# Patient Record
Sex: Female | Born: 1995 | Race: White | Hispanic: No | Marital: Single | Smoking: Never smoker
Health system: Southern US, Community
[De-identification: ages and names within clinical notes are randomized; demographics above are authoritative.]

## PROBLEM LIST (undated history)

## (undated) DIAGNOSIS — B9681 Helicobacter pylori [H. pylori] as the cause of diseases classified elsewhere: Secondary | ICD-10-CM

## (undated) DIAGNOSIS — F32A Depression, unspecified: Secondary | ICD-10-CM

## (undated) DIAGNOSIS — F419 Anxiety disorder, unspecified: Secondary | ICD-10-CM

## (undated) DIAGNOSIS — F329 Major depressive disorder, single episode, unspecified: Secondary | ICD-10-CM

## (undated) HISTORY — DX: Depression, unspecified: F32.A

## (undated) HISTORY — DX: Anxiety disorder, unspecified: F41.9

---
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
## 2019-01-05 LAB — HEPATIC FUNCTION PANEL
ALT: 12 (ref 7–35)
AST: 13 (ref 13–35)
Alkaline Phosphatase: 40 (ref 25–125)
Bilirubin, Total: 0.5

## 2019-01-05 LAB — BASIC METABOLIC PANEL
BUN: 16 (ref 4–21)
Creatinine: 0.9 (ref 0.5–1.1)
Glucose: 92
Potassium: 3.8 (ref 3.4–5.3)
Sodium: 145 (ref 137–147)

## 2019-01-05 LAB — CBC AND DIFFERENTIAL
HCT: 40 (ref 36–46)
Hemoglobin: 13.6 (ref 12.0–16.0)
Platelets: 224 (ref 150–399)
WBC: 4.5

## 2019-01-05 LAB — TSH: TSH: 1.04 (ref 0.41–5.90)

## 2019-01-06 ENCOUNTER — Other Ambulatory Visit: Payer: Self-pay

## 2019-01-06 ENCOUNTER — Emergency Department (HOSPITAL_COMMUNITY): Payer: BC Managed Care – PPO

## 2019-01-06 ENCOUNTER — Encounter (HOSPITAL_COMMUNITY): Payer: Self-pay | Admitting: Emergency Medicine

## 2019-01-06 ENCOUNTER — Emergency Department (HOSPITAL_COMMUNITY)
Admission: EM | Admit: 2019-01-06 | Discharge: 2019-01-07 | Disposition: A | Discharge status: Home / Self Care | Payer: BC Managed Care – PPO | Attending: Emergency Medicine | Admitting: Emergency Medicine

## 2019-01-06 DIAGNOSIS — Z8719 Personal history of other diseases of the digestive system: Secondary | ICD-10-CM | POA: Diagnosis not present

## 2019-01-06 DIAGNOSIS — Z880 Allergy status to penicillin: Secondary | ICD-10-CM | POA: Insufficient documentation

## 2019-01-06 DIAGNOSIS — R030 Elevated blood-pressure reading, without diagnosis of hypertension: Secondary | ICD-10-CM | POA: Diagnosis present

## 2019-01-06 DIAGNOSIS — F419 Anxiety disorder, unspecified: Secondary | ICD-10-CM | POA: Diagnosis not present

## 2019-01-06 HISTORY — DX: Helicobacter pylori (H. pylori) as the cause of diseases classified elsewhere: B96.81

## 2019-01-06 LAB — CBC
HCT: 39.5 % (ref 36.0–46.0)
Hemoglobin: 13.3 g/dL (ref 12.0–15.0)
MCH: 30.8 pg (ref 26.0–34.0)
MCHC: 33.7 g/dL (ref 30.0–36.0)
MCV: 91.4 fL (ref 80.0–100.0)
Platelets: 217 10*3/uL (ref 150–400)
RBC: 4.32 MIL/uL (ref 3.87–5.11)
RDW: 11.9 % (ref 11.5–15.5)
WBC: 5.5 10*3/uL (ref 4.0–10.5)
nRBC: 0 % (ref 0.0–0.2)

## 2019-01-06 LAB — BASIC METABOLIC PANEL
Anion gap: 15 (ref 5–15)
BUN: 12 mg/dL (ref 6–20)
CO2: 20 mmol/L — ABNORMAL LOW (ref 22–32)
Calcium: 9.8 mg/dL (ref 8.9–10.3)
Chloride: 103 mmol/L (ref 98–111)
Creatinine, Ser: 0.89 mg/dL (ref 0.44–1.00)
GFR calc Af Amer: >60 mL/min (ref >60)
GFR calc non Af Amer: >60 mL/min (ref >60)
Glucose, Bld: 88 mg/dL (ref 70–99)
Potassium: 3.4 mmol/L — ABNORMAL LOW (ref 3.5–5.1)
Sodium: 138 mmol/L (ref 135–145)

## 2019-01-06 LAB — I-STAT BETA HCG BLOOD, ED (MC, WL, AP ONLY): I-stat hCG, quantitative: <5 m[IU]/mL (ref <5)

## 2019-01-06 MED ORDER — SODIUM CHLORIDE 0.9% FLUSH: 3.0000 mL | Freq: Once | INTRAVENOUS | Status: DC

## 2019-01-06 NOTE — ED Notes (Signed)
Mom-- Kenyotta Dorfman   [phone]

## 2019-01-06 NOTE — ED Triage Notes (Signed)
Pt states she has been vomiting for weeks.Pt was diagnosed with H Pylori and started on medications. Pt had all her medications switched yesterday after noticing a rash on her chest two days ago.after taking the new medications, pt had a hot flash. About an hour ago,pt felt shaky all over, feels like she has a high heart rate and high blood pressure. Pt came here to be evaluated.

## 2019-01-07 NOTE — Discharge Instructions (Addendum)
Follow-up with the specialist tomorrow.  In the mean time, stick with the plan of having discontinued all the medications.  Return if symptoms worsen.

## 2019-01-07 NOTE — ED Provider Notes (Signed)
[facility]   CSN: [id number] Arrival date & time: 01/06/19  [id number]     History   Chief Complaint No chief complaint on file.   HPI [name] is a 23 y.o. female.     Patient presents to the emergency department with a chief complaint of rapid heart rate and high blood pressure.  She states that she was recently diagnosed with H. pylori.  She reports that she was prescribed medications for the H. pylori, but shortly after starting them noticed a rash on her body.  She was advised to stop taking the medications.  She states that she took some Benadryl.  This all occurred yesterday.  She states that today she began feeling very anxious, and noticed that her blood pressure was 160/100 and that her heart rate was fast.  She does have a history of anxiety.  She is concerned that she is having more reactions to the medications or that something else was going on.  She has a follow-up appointment with a gastroenterologist tomorrow.  The history is provided by the patient. No language interpreter was used.    Past Medical History:  Diagnosis Date  . H pylori ulcer     There are no active problems to display for this patient.   History reviewed. No pertinent surgical history.   OB History   No obstetric history on file.      Home Medications    Prior to Admission medications   Medication Sig Start Date End Date Taking? Authorizing Provider  influenza virus trivalent vaccine (FLUZONE) injection Inject 0.5 mLs into the muscle once. 06/03/12   David StallBrennan, Michael J, MD    Family History History reviewed. No pertinent family history.  Social History Social History   Tobacco Use  . Smoking status: Never Smoker  . Smokeless tobacco: Never Used  Substance Use Topics  . Alcohol use: Not Currently  . Drug use: Yes    Types: Marijuana     Allergies   Penicillins   Review of Systems Review of Systems  All other  systems reviewed and are negative.    Physical Exam Updated Vital Signs BP 120/78   Pulse 72   Temp 98.4 F (36.9 C) (Oral)   Resp 14   Ht 5\' 5"  (1.651 m)   Wt 49.9 kg   LMP 12/22/2018   SpO2 98%   BMI 18.30 kg/m   Physical Exam Vitals signs and nursing note reviewed.  Constitutional:      General: She is not in acute distress.    Appearance: She is well-developed.  HENT:     Head: Normocephalic and atraumatic.  Eyes:     Conjunctiva/sclera: Conjunctivae normal.  Neck:     Musculoskeletal: Neck supple.  Cardiovascular:     Rate and Rhythm: Normal rate and regular rhythm.     Heart sounds: No murmur.  Pulmonary:     Effort: Pulmonary effort is normal. No respiratory distress.     Breath sounds: Normal breath sounds.     Comments: Clear to auscultation bilaterally Abdominal:     Palpations: Abdomen is soft.     Tenderness: There is no abdominal tenderness.     Comments: Abdomen soft and nontender  Musculoskeletal: Normal range of motion.  Skin:    General: Skin is warm and dry.  Neurological:     Mental Status: She is alert.  Psychiatric:        Mood and Affect: Mood  normal.        Thought Content: Thought content normal.        Judgment: Judgment normal.     Comments: Anxious      ED Treatments / Results  Labs (all labs ordered are listed, but only abnormal results are displayed) Labs Reviewed  BASIC METABOLIC PANEL - Abnormal; Notable for the following components:      Result Value   Potassium 3.4 (*)    CO2 20 (*)    All other components within normal limits  CBC  I-STAT BETA HCG BLOOD, ED ([facility], [facility], [facility] ONLY)    EKG EKG Interpretation  Date/Time:  Tuesday January 06 2019 18:55:57 EDT Ventricular Rate:  106 PR Interval:  114 QRS Duration: 80 QT Interval:  326 QTC Calculation: 433 R Axis:   91 Text Interpretation:  Sinus tachycardia Rightward axis Nonspecific ST abnormality Abnormal ECG No old tracing to compare Confirmed by Delora Fuel ([id number])  on 01/06/2019 11:44:31 PM   Radiology Dg Chest 2 View  Result Date: 01/06/2019 CLINICAL DATA:  Shortness of breath. Vomiting for weeks. And ID today. EXAM: CHEST - 2 VIEW COMPARISON:  05/21/2011 scoliosis radiographs. FINDINGS: The cardiomediastinal silhouette is within normal limits. The lungs are well inflated and clear. No pleural effusion or pneumothorax is identified. There is mild lower thoracic levoscoliosis. IMPRESSION: No active cardiopulmonary disease. Electronically Signed   By: Logan Bores M.D.   On: 01/06/2019 20:19    Procedures Procedures (including critical care time)  Medications Ordered in ED Medications  sodium chloride flush (NS) 0.9 % injection 3 mL (3 mLs Intravenous Not Given 01/07/19 0059)     Initial Impression / Assessment and Plan / ED Course  I have reviewed the triage vital signs and the nursing notes.  Pertinent labs & imaging results that were available during my care of the patient were reviewed by me and considered in my medical decision making (see chart for details).        Patient here after having had what appears to be a panic attack at home.  See HPI for event.  Vital signs are stable.  She is well-appearing.  She is in no acute distress.  Laboratory work-up is reassuring.  She has follow-up with a specialist tomorrow for her H. pylori.  No further intervention or work-up needed at this time.  Final Clinical Impressions(s) / ED Diagnoses   Final diagnoses:  Anxiousness    ED Discharge Orders    None       Montine Circle, PA-C 00/34/91 [id number]    Delora Fuel, MD 05/69/79 2321

## 2019-01-22 ENCOUNTER — Telehealth: Payer: Self-pay | Admitting: Nurse Practitioner

## 2019-01-22 NOTE — Telephone Encounter (Signed)

## 2019-01-23 ENCOUNTER — Encounter: Payer: Self-pay | Admitting: Nurse Practitioner

## 2019-01-23 ENCOUNTER — Other Ambulatory Visit: Payer: Self-pay

## 2019-01-23 ENCOUNTER — Ambulatory Visit (INDEPENDENT_AMBULATORY_CARE_PROVIDER_SITE_OTHER): Payer: BC Managed Care – PPO | Admitting: Nurse Practitioner

## 2019-01-23 VITALS — BP 110/72 | HR 91 | Temp 98.8°F | Ht 65.0 in | Wt 104.4 lb

## 2019-01-23 DIAGNOSIS — Z8659 Personal history of other mental and behavioral disorders: Secondary | ICD-10-CM | POA: Diagnosis not present

## 2019-01-23 DIAGNOSIS — F4323 Adjustment disorder with mixed anxiety and depressed mood: Secondary | ICD-10-CM

## 2019-01-23 DIAGNOSIS — R1013 Epigastric pain: Secondary | ICD-10-CM

## 2019-01-23 DIAGNOSIS — N6310 Unspecified lump in the right breast, unspecified quadrant: Secondary | ICD-10-CM

## 2019-01-23 DIAGNOSIS — F411 Generalized anxiety disorder: Secondary | ICD-10-CM | POA: Insufficient documentation

## 2019-01-23 DIAGNOSIS — Z9152 Personal history of nonsuicidal self-harm: Secondary | ICD-10-CM

## 2019-01-23 MED ORDER — ESCITALOPRAM OXALATE 10 MG PO TABS: 10.0000 mg | ORAL_TABLET | Freq: Every day | ORAL | 1 refills | Status: DC

## 2019-01-23 NOTE — Assessment & Plan Note (Signed)
Cutting Last done 69yrs ago. No previous suicide attempt. No Fhx of suicide.

## 2019-01-23 NOTE — Patient Instructions (Addendum)
I instructed pt to start 1/2 tablet once daily for 1 week and then increase to a full tablet once daily on week two as tolerated.   We discussed common side effects such as nausea, drowsiness and weight gain.  Also discussed rare but serious side effect of suicide ideation.  She is instructed to discontinue medication go directly to ED if this occurs.  Pt verbalizes understanding.   Plan follow up in 1 month to evaluate progress.    You will be contacted to schedule appt with psychology.  Let me know if breast mass and tenderness does not resolve after completion of menstrual cycle. Will order breast Ultrasound.  Sign medical release form to get records from urgent care clinic and [facility]

## 2019-01-23 NOTE — Progress Notes (Signed)
Subjective:  Patient ID: Michaela Carroll, female    DOB: [birth year]  Age: 23 y.o. MRN: [id number]  CC: Establish Care (Est care--H-Pylori consult--dx from urgent care 1 mo--since then pt has been nervouse/anxiety about this and lump,tender on right breast area/FYI--pt mention PAP done form urgent care 1 mo)  Anxiety Presents for initial visit. Onset was 1 to 5 years ago. The problem has been waxing and waning. Symptoms include decreased concentration, depressed mood, excessive worry, insomnia, irritability, muscle tension, nausea, nervous/anxious behavior, palpitations, panic and restlessness. Patient reports no chest pain, confusion, dizziness, dry mouth, feeling of choking, malaise, shortness of breath or suicidal ideas. Symptoms occur constantly. The severity of symptoms is causing significant distress and interfering with daily activities. The symptoms are aggravated by social activities.   Risk factors include family history. Her past medical history is significant for anxiety/panic attacks and depression. There is no history of bipolar disorder, hyperthyroidism or suicide attempts. (Hx of self mutilation, last done 5073yrs ago) Past treatments include nothing.   Right breast tenderness and mass: First noticed this week, LMP 12/24/2018, expecting next cycle to start anytime now.  reports she was diagnosed with H.pylori gastritis 35month ago. Use of oral abx x1week. Unable to complete oral abx due to side effects. Followed by GI with [facility]. Last seen 01/22/2019.  Reviewed past Medical, Social and Family history today.  Outpatient Medications Prior to Visit  Medication Sig Dispense Refill  . influenza virus trivalent vaccine (FLUZONE) injection Inject 0.5 mLs into the muscle once. (Patient not taking: Reported on 01/23/2019) 0.5 mL 0   No facility-administered medications prior to visit.     ROS See HPI  Objective:  BP 110/72   Pulse 91   Temp 98.8 F (37.1 C) (Oral)    Ht 5\' 5"  (1.651 m)   Wt 104 lb 6.4 oz (47.4 kg)   SpO2 100%   BMI 17.37 kg/m   BP Readings from Last 3 Encounters:  01/23/19 110/72  01/07/19 120/78    Wt Readings from Last 3 Encounters:  01/23/19 104 lb 6.4 oz (47.4 kg)  01/06/19 110 lb (49.9 kg)    Physical Exam Vitals signs reviewed.  Neck:     Musculoskeletal: Normal range of motion and neck supple.     Thyroid: No thyroid mass, thyromegaly or thyroid tenderness.  Cardiovascular:     Rate and Rhythm: Normal rate and regular rhythm.     Pulses: Normal pulses.  Pulmonary:     Effort: Pulmonary effort is normal.  Chest:     Chest wall: Mass present.     Breasts:        Right: Inverted nipple, mass and tenderness present. No swelling, bleeding, nipple discharge or skin change.        Left: Inverted nipple present. No mass, nipple discharge, skin change or tenderness.    Lymphadenopathy:     Cervical: No cervical adenopathy.     Upper Body:     Right upper body: No supraclavicular, axillary or pectoral adenopathy.     Left upper body: No supraclavicular, axillary or pectoral adenopathy.  Skin:    General: Skin is warm and dry.     Findings: No erythema or rash.  Neurological:     Mental Status: She is alert and oriented to person, place, and time.  Psychiatric:        Attention and Perception: Attention normal.        Mood and Affect: Mood is anxious. Mood is not  depressed.        Speech: Speech normal.        Behavior: Behavior is cooperative.        Thought Content: Thought content normal.        Cognition and Memory: Cognition normal.     Lab Results  Component Value Date   WBC 5.5 01/06/2019   HGB 13.3 01/06/2019   HCT 39.5 01/06/2019   PLT 217 01/06/2019   GLUCOSE 88 01/06/2019   NA 138 01/06/2019   K 3.4 (L) 01/06/2019   CL 103 01/06/2019   CREATININE 0.89 01/06/2019   BUN 12 01/06/2019   CO2 20 (L) 01/06/2019    Dg Chest 2 View  Result Date: 01/06/2019 CLINICAL DATA:  Shortness of breath.  Vomiting for weeks. And ID today. EXAM: CHEST - 2 VIEW COMPARISON:  05/21/2011 scoliosis radiographs. FINDINGS: The cardiomediastinal silhouette is within normal limits. The lungs are well inflated and clear. No pleural effusion or pneumothorax is identified. There is mild lower thoracic levoscoliosis. IMPRESSION: No active cardiopulmonary disease. Electronically Signed   By: Logan Bores M.D.   On: 01/06/2019 20:19    Assessment & Plan:   Carmell was seen today for establish care.  Diagnoses and all orders for this visit:  Adjustment disorder with mixed anxiety and depressed mood -     escitalopram (LEXAPRO) 10 MG tablet; Take 1 tablet (10 mg total) by mouth daily. -     Ambulatory referral to Psychology  Breast mass, right  Dyspepsia  Hx of self mutilation -     escitalopram (LEXAPRO) 10 MG tablet; Take 1 tablet (10 mg total) by mouth daily. -     Ambulatory referral to Psychology   I am having Tressie Stalker start on escitalopram. I am also having her maintain her influenza virus trivalent vaccine.  Meds ordered this encounter  Medications  . escitalopram (LEXAPRO) 10 MG tablet    Sig: Take 1 tablet (10 mg total) by mouth daily.    Dispense:  30 tablet    Refill:  1    Order Specific Question:   Supervising Provider    Answer:   MATTHEWS, CODY [[id number]]    Problem List Items Addressed This Visit      Other   Adjustment disorder with mixed anxiety and depressed mood - Primary   Relevant Medications   escitalopram (LEXAPRO) 10 MG tablet   Other Relevant Orders   Ambulatory referral to Psychology   Breast mass, right   Dyspepsia   Hx of self mutilation    Cutting Last done 71yrs ago. No previous suicide attempt. No Fhx of suicide.      Relevant Medications   escitalopram (LEXAPRO) 10 MG tablet   Other Relevant Orders   Ambulatory referral to Psychology       Follow-up: Return in about 4 weeks (around 02/20/2019) for CPE and f/up anxiety and depression.   Wilfred Lacy, NP

## 2019-01-26 ENCOUNTER — Encounter: Payer: Self-pay | Admitting: Nurse Practitioner

## 2019-01-26 DIAGNOSIS — F4323 Adjustment disorder with mixed anxiety and depressed mood: Secondary | ICD-10-CM

## 2019-01-27 MED ORDER — BUSPIRONE HCL 7.5 MG PO TABS: 7.5000 mg | ORAL_TABLET | Freq: Two times a day (BID) | ORAL | 2 refills | Status: DC

## 2019-02-02 ENCOUNTER — Ambulatory Visit: Payer: Self-pay | Admitting: Psychology

## 2019-02-03 ENCOUNTER — Encounter: Payer: Self-pay | Admitting: Nurse Practitioner

## 2019-02-03 DIAGNOSIS — A048 Other specified bacterial intestinal infections: Secondary | ICD-10-CM | POA: Insufficient documentation

## 2019-02-19 ENCOUNTER — Telehealth: Payer: Self-pay | Admitting: Nurse Practitioner

## 2019-02-19 NOTE — Telephone Encounter (Signed)

## 2019-02-20 ENCOUNTER — Ambulatory Visit: Payer: Self-pay | Admitting: Nurse Practitioner

## 2019-02-20 ENCOUNTER — Other Ambulatory Visit (HOSPITAL_COMMUNITY)
Admission: RE | Admit: 2019-02-20 | Discharge: 2019-02-20 | Disposition: A | Discharge status: Home / Self Care | Payer: Self-pay | Source: Ambulatory Visit | Attending: Nurse Practitioner | Admitting: Nurse Practitioner

## 2019-02-20 ENCOUNTER — Encounter: Payer: Self-pay | Admitting: Nurse Practitioner

## 2019-02-20 ENCOUNTER — Other Ambulatory Visit: Payer: Self-pay

## 2019-02-20 VITALS — BP 106/66 | HR 67 | Temp 97.8°F | Ht 65.0 in | Wt 105.0 lb

## 2019-02-20 DIAGNOSIS — F4323 Adjustment disorder with mixed anxiety and depressed mood: Secondary | ICD-10-CM

## 2019-02-20 DIAGNOSIS — Z0000 Encounter for general adult medical examination without abnormal findings: Secondary | ICD-10-CM | POA: Insufficient documentation

## 2019-02-20 DIAGNOSIS — N6310 Unspecified lump in the right breast, unspecified quadrant: Secondary | ICD-10-CM

## 2019-02-20 DIAGNOSIS — Z124 Encounter for screening for malignant neoplasm of cervix: Secondary | ICD-10-CM | POA: Insufficient documentation

## 2019-02-20 DIAGNOSIS — Z136 Encounter for screening for cardiovascular disorders: Secondary | ICD-10-CM

## 2019-02-20 DIAGNOSIS — Z13220 Encounter for screening for lipoid disorders: Secondary | ICD-10-CM

## 2019-02-20 LAB — COMPREHENSIVE METABOLIC PANEL
ALT: 9 U/L (ref 0–35)
AST: 14 U/L (ref 0–37)
Albumin: 4.6 g/dL (ref 3.5–5.2)
Alkaline Phosphatase: 32 U/L — ABNORMAL LOW (ref 39–117)
BUN: 19 mg/dL (ref 6–23)
CO2: 25 mEq/L (ref 19–32)
Calcium: 9.5 mg/dL (ref 8.4–10.5)
Chloride: 107 mEq/L (ref 96–112)
Creatinine, Ser: 0.77 mg/dL (ref 0.40–1.20)
GFR: 94.54 mL/min (ref >60.00)
Glucose, Bld: 87 mg/dL (ref 70–99)
Potassium: 3.7 mEq/L (ref 3.5–5.1)
Sodium: 139 mEq/L (ref 135–145)
Total Bilirubin: 0.7 mg/dL (ref 0.2–1.2)
Total Protein: 6.8 g/dL (ref 6.0–8.3)

## 2019-02-20 LAB — LIPID PANEL
Cholesterol: 168 mg/dL (ref 0–200)
HDL: 63.8 mg/dL (ref >39.00)
LDL Cholesterol: 95 mg/dL (ref 0–99)
NonHDL: 104.38
Total CHOL/HDL Ratio: 3
Triglycerides: 49 mg/dL (ref 0.0–149.0)
VLDL: 9.8 mg/dL (ref 0.0–40.0)

## 2019-02-20 LAB — TSH: TSH: 1.46 u[IU]/mL (ref 0.35–4.50)

## 2019-02-20 MED ORDER — BUSPIRONE HCL 7.5 MG PO TABS: 7.5000 mg | ORAL_TABLET | Freq: Three times a day (TID) | ORAL | 11 refills | Status: DC

## 2019-02-20 NOTE — Progress Notes (Signed)
Subjective:    Patient ID: Michaela Carroll, female    DOB: 12/10/1995, 23 y.o.   MRN: [id number]  Patient presents today for complete physical and f/up on anxiety  HPI  Sexual History (orientation,birth control, marital status, STD):single, sexually active, use of condoms  Depression/Suicide:unable to tolerate lexapro (nausea). reports improved mood with buspar TID without any adverse reaction. Upcoming appt with therapist Vesta Mixer([facility]) [facility] 01/23/2019 01/23/2019  Decreased Interest 0 3  Down, Depressed, Hopeless 1 3  PHQ - 2 Score 1 6  Altered sleeping 3 -  Tired, decreased energy 2 -  Change in appetite 3 -  Feeling bad or failure about yourself  3 -  Trouble concentrating 3 -  Moving slowly or fidgety/restless 2 -  Suicidal thoughts 0 -  PHQ-9 Score 17 -   Vision:up to date.  Dental:up to date  Immunizations: (TDAP, Hep C screen, Pneumovax, Influenza, zoster)  Health Maintenance  Topic Date Due  . PAP-Cervical Cytology Screening  01/16/2019  . Pap Smear  01/16/2019  . Flu Shot  02/07/2019  . HIV Screening  02/20/2020*  . Tetanus Vaccine  02/02/2020  *Topic was postponed. The date shown is not the original due date.   Diet:regular.  Weight:  Wt Readings from Last 3 Encounters:  02/20/19 105 lb (47.6 kg)  01/23/19 104 lb 6.4 oz (47.4 kg)  01/06/19 110 lb (49.9 kg)   Exercise:none  Fall Risk: Fall Risk  01/23/2019  Falls in the past year? 0   Medications and allergies reviewed with patient and updated if appropriate.  Patient Active Problem List   Diagnosis Date Noted  . H. pylori infection 02/03/2019  . Breast mass, right 01/23/2019  . Adjustment disorder with mixed anxiety and depressed mood 01/23/2019  . Dyspepsia 01/23/2019  . Hx of self mutilation 01/23/2019    No current outpatient medications on file prior to visit.   No current facility-administered medications on file prior to visit.     Past Medical History:  Diagnosis  Date  . Anxiety   . Depression   . H pylori ulcer     No past surgical history on file.  Social History   Socioeconomic History  . Marital status: Single    Spouse name: Not on file  . Number of children: 0  . Years of education: Not on file  . Highest education level: Not on file  Occupational History  . Not on file  Social Needs  . Financial resource strain: Not on file  . Food insecurity    Worry: Not on file    Inability: Not on file  . Transportation needs    Medical: Not on file    Non-medical: Not on file  Tobacco Use  . Smoking status: Never Smoker  . Smokeless tobacco: Never Used  Substance and Sexual Activity  . Alcohol use: Not Currently    Comment: social  . Drug use: Not Currently    Types: Marijuana  . Sexual activity: Yes    Birth control/protection: Condom  Lifestyle  . Physical activity    Days per week: Not on file    Minutes per session: Not on file  . Stress: Not on file  Relationships  . Social Musicianconnections    Talks on phone: Not on file    Gets together: Not on file    Attends religious service: Not on file    Active member of club or organization: Not on file    Attends  meetings of clubs or organizations: Not on file    Relationship status: Not on file  Other Topics Concern  . Not on file  Social History Narrative  . Not on file    Family History  Problem Relation Age of Onset  . Thyroid disease Mother   . Depression Mother   . Hypertension Father   . Diabetes Sister        Type 1  . Heart disease Paternal Grandfather   . Cancer Paternal Grandmother 24       breast cancer        Review of Systems  Constitutional: Negative for fever, malaise/fatigue and weight loss.  HENT: Negative for congestion and sore throat.   Eyes:       Negative for visual changes  Respiratory: Negative for cough and shortness of breath.   Cardiovascular: Negative for chest pain, palpitations and leg swelling.  Gastrointestinal: Negative for blood  in stool, constipation, diarrhea and heartburn.  Genitourinary: Negative for dysuria, frequency and urgency.  Musculoskeletal: Negative for falls, joint pain and myalgias.  Skin: Negative for rash.  Neurological: Negative for dizziness, sensory change and headaches.  Endo/Heme/Allergies: Does not bruise/bleed easily.  Psychiatric/Behavioral: Negative for depression, substance abuse and suicidal ideas. The patient is not nervous/anxious and does not have insomnia.     Objective:   Vitals:   02/20/19 0917  BP: 106/66  Pulse: 67  Temp: 97.8 F (36.6 C)  SpO2: 99%    Body mass index is 17.47 kg/m.   Physical Examination:  Physical Exam Vitals signs reviewed. Exam conducted with a chaperone present.  Constitutional:      General: She is not in acute distress.    Appearance: Normal appearance.  HENT:     Head: Normocephalic.     Right Ear: Tympanic membrane, ear canal and external ear normal.     Left Ear: Tympanic membrane, ear canal and external ear normal.  Eyes:     General: No scleral icterus.    Extraocular Movements: Extraocular movements intact.     Conjunctiva/sclera: Conjunctivae normal.  Neck:     Musculoskeletal: Normal range of motion and neck supple.     Thyroid: No thyromegaly.  Cardiovascular:     Rate and Rhythm: Normal rate and regular rhythm.     Pulses: Normal pulses.     Heart sounds: Normal heart sounds.  Pulmonary:     Effort: Pulmonary effort is normal.     Breath sounds: Normal breath sounds.  Chest:     Breasts: Breasts are symmetrical.        Right: Inverted nipple and mass present. No swelling, bleeding, nipple discharge, skin change or tenderness.        Left: Inverted nipple present. No swelling, bleeding, mass, nipple discharge, skin change or tenderness.    Abdominal:     General: Bowel sounds are normal. There is no distension.     Palpations: Abdomen is soft.     Tenderness: There is no abdominal tenderness.     Hernia: There is  no hernia in the left inguinal area or right inguinal area.  Genitourinary:    General: Normal vulva.     Labia:        Right: No rash or tenderness.        Left: No rash or tenderness.      Vagina: Normal. No vaginal discharge.     Cervix: No cervical motion tenderness, discharge, friability, lesion or erythema.  Adnexa: Right adnexa normal and left adnexa normal.     Rectum: Normal.  Musculoskeletal: Normal range of motion.        General: No tenderness.  Lymphadenopathy:     Cervical: No cervical adenopathy.     Upper Body:     Right upper body: No supraclavicular, axillary or pectoral adenopathy.     Left upper body: No supraclavicular, axillary or pectoral adenopathy.     Lower Body: No right inguinal adenopathy. No left inguinal adenopathy.  Skin:    General: Skin is warm and dry.  Neurological:     Mental Status: She is alert and oriented to person, place, and time.  Psychiatric:        Mood and Affect: Mood normal.        Behavior: Behavior normal.        Thought Content: Thought content normal.        Judgment: Judgment normal.     ASSESSMENT and PLAN:  Michaela Carroll was seen today for annual exam.  Diagnoses and all orders for this visit:  Preventative health care -     Comprehensive metabolic panel -     TSH -     Lipid panel -     Cytology - PAP( [place])  Encounter for lipid screening for cardiovascular disease -     Lipid panel  Encounter for Papanicolaou smear for cervical cancer screening -     Cytology - PAP( [place])  Breast mass, right -     US BREAST COMPLETE UNI RIGHT INC AXILLA; Future  Adjustment disorder with mixed anxiety and depressed mood -     busPIRone (BUSPAR) 7.5 MG tablet; Take 1 tablet (7.5 mg total) by mouth 3 (three) times daily.    No problem-specific Assessment & Plan notes found for this encounter.      Problem List Items Addressed This Visit      Other   Adjustment disorder with mixed anxiety and depressed  mood   Relevant Medications   busPIRone (BUSPAR) 7.5 MG tablet   Breast mass, right   Relevant Orders   US BREAST COMPLETE UNI RIGHT INC AXILLA    Other Visit Diagnoses    Preventative health care    -  Primary   Relevant Orders   Comprehensive metabolic panel   TSH   Lipid panel   Cytology - PAP( [place])   Encounter for lipid screening for cardiovascular disease       Relevant Orders   Lipid panel   Encounter for Papanicolaou smear for cervical cancer screening       Relevant Orders   Cytology - PAP( [place])       Follow up: Return in about 3 months (around 05/23/2019) for anxiety (virtual appt).  Alysia Pennaharlotte Kymberlee Viger, NP

## 2019-02-20 NOTE — Patient Instructions (Signed)
Go to lab for blood draw.  You will be contacted with lab results.  Continue buspar as prescribed. Maintain appt with therapist.   Preventive Care 69-21 Years Old, Female Preventive care refers to lifestyle choices and visits with your health care provider that can promote health and wellness. At this stage in your life, you may start seeing a primary care physician instead of a pediatrician. Your health care is now your responsibility. Preventive care for young adults includes:  A yearly physical exam. This is also called an annual wellness visit.  Regular dental and eye exams.  Immunizations.  Screening for certain conditions.  Healthy lifestyle choices, such as diet and exercise. What can I expect for my preventive care visit? Physical exam Your health care provider may check:  Height and weight. These may be used to calculate body mass index (BMI), which is a measurement that tells if you are at a healthy weight.  Heart rate and blood pressure.  Body temperature. Counseling Your health care provider may ask you questions about:  Past medical problems and family medical history.  Alcohol, tobacco, and drug use.  Home and relationship well-being.  Access to firearms.  Emotional well-being.  Diet, exercise, and sleep habits.  Sexual activity and sexual health.  Method of birth control.  Menstrual cycle.  Pregnancy history. What immunizations do I need?  Influenza (flu) vaccine  This is recommended every year. Tetanus, diphtheria, and pertussis (Tdap) vaccine  You may need a Td booster every 10 years. Varicella (chickenpox) vaccine  You may need this vaccine if you have not already been vaccinated. Human papillomavirus (HPV) vaccine  If recommended by your health care provider, you may need three doses over 6 months. Measles, mumps, and rubella (MMR) vaccine  You may need at least one dose of MMR. You may also need a second dose. Meningococcal  conjugate (MenACWY) vaccine  One dose is recommended if you are 35-39 years old and a Market researcher living in a residence hall, or if you have one of several medical conditions. You may also need additional booster doses. Pneumococcal conjugate (PCV13) vaccine  You may need this if you have certain conditions and were not previously vaccinated. Pneumococcal polysaccharide (PPSV23) vaccine  You may need one or two doses if you smoke cigarettes or if you have certain conditions. Hepatitis A vaccine  You may need this if you have certain conditions or if you travel or work in places where you may be exposed to hepatitis A. Hepatitis B vaccine  You may need this if you have certain conditions or if you travel or work in places where you may be exposed to hepatitis B. Haemophilus influenzae type b (Hib) vaccine  You may need this if you have certain risk factors. You may receive vaccines as individual doses or as more than one vaccine together in one shot (combination vaccines). Talk with your health care provider about the risks and benefits of combination vaccines. What tests do I need? Blood tests  Lipid and cholesterol levels. These may be checked every 5 years starting at age 37.  Hepatitis C test.  Hepatitis B test. Screening  Pelvic exam and Pap test. This may be done every 3 years starting at age 55.  Sexually transmitted disease (STD) testing, if you are at risk.  BRCA-related cancer screening. This may be done if you have a family history of breast, ovarian, tubal, or peritoneal cancers. Other tests  Tuberculosis skin test.  Vision and hearing  tests.  Skin exam.  Breast exam. Follow these instructions at home: Eating and drinking   Eat a diet that includes fresh fruits and vegetables, whole grains, lean protein, and low-fat dairy products.  Drink enough fluid to keep your urine pale yellow.  Do not drink alcohol if: ? Your health care provider  tells you not to drink. ? You are pregnant, may be pregnant, or are planning to become pregnant. ? You are under the legal drinking age. In the [place] the legal drinking age is 2.  If you drink alcohol: ? Limit how much you have to 0-1 drink a day. ? Be aware of how much alcohol is in your drink. In the [place] one drink equals one 12 oz bottle of beer (355 mL), one 5 oz glass of wine (148 mL), or one 1 oz glass of hard liquor (44 mL). Lifestyle  Take daily care of your teeth and gums.  Stay active. Exercise at least 30 minutes 5 or more days of the week.  Do not use any products that contain nicotine or tobacco, such as cigarettes, e-cigarettes, and chewing tobacco. If you need help quitting, ask your health care provider.  Do not use drugs.  If you are sexually active, practice safe sex. Use a condom or other form of birth control (contraception) in order to prevent pregnancy and STIs (sexually transmitted infections). If you plan to become pregnant, see your health care provider for a pre-conception visit.  Find healthy ways to cope with stress, such as: ? Meditation, yoga, or listening to music. ? Journaling. ? Talking to a trusted person. ? Spending time with friends and family. Safety  Always wear your seat belt while driving or riding in a vehicle.  Do not drive if you have been drinking alcohol. Do not ride with someone who has been drinking.  Do not drive when you are tired or distracted. Do not text while driving.  Wear a helmet and other protective equipment during sports activities.  If you have firearms in your house, make sure you follow all gun safety procedures.  Seek help if you have been bullied, physically abused, or sexually abused.  Use the Internet responsibly to avoid dangers such as online bullying and online sex predators. What's next?  Go to your health care provider once a year for a well check visit.  Ask your health care provider how often you  should have your eyes and teeth checked.  Stay up to date on all vaccines. This information is not intended to replace advice given to you by your health care provider. Make sure you discuss any questions you have with your health care provider. Document Released: 11/10/2015 Document Revised: 06/19/2018 Document Reviewed: 06/19/2018 Elsevier Patient Education  2020 [employer].

## 2019-02-23 LAB — CYTOLOGY - PAP: Diagnosis: NEGATIVE

## 2019-02-25 ENCOUNTER — Encounter: Payer: Self-pay | Admitting: Nurse Practitioner

## 2019-02-25 NOTE — Progress Notes (Signed)
Abstracted result and sent to scan  

## 2019-02-26 LAB — URINE CYTOLOGY ANCILLARY ONLY
Bilirubin (Urine): POSITIVE
Blood, UA: POSITIVE — AB
Glucose, Ur: NEGATIVE
Ketones, urine: POSITIVE
Leukocytes,UA: POSITIVE
Nitrite, UA: POSITIVE
Protein, Ur: POSITIVE
Urobilinogen, UA: POSITIVE

## 2019-02-26 LAB — STD PANEL
Atopobium vaginae: 0
BVAB 2: 0
Candida albicans, NAA: NEGATIVE
Candida glabrata, NAA: NEGATIVE
Chlamydia trachomatis, NAA: NEGATIVE
Megasphaera 1: 0
Neisseria gonorrhoeae, NAA: NEGATIVE
Trich vag by NAA: NEGATIVE

## 2019-03-04 ENCOUNTER — Encounter: Payer: Self-pay | Admitting: Nurse Practitioner

## 2019-04-13 ENCOUNTER — Other Ambulatory Visit: Payer: Self-pay

## 2019-04-13 ENCOUNTER — Ambulatory Visit: Payer: Self-pay | Admitting: Nurse Practitioner

## 2019-04-13 ENCOUNTER — Encounter: Payer: Self-pay | Admitting: Nurse Practitioner

## 2019-04-13 VITALS — BP 110/78 | HR 83 | Temp 98.6°F | Ht 65.0 in | Wt 105.0 lb

## 2019-04-13 DIAGNOSIS — F4323 Adjustment disorder with mixed anxiety and depressed mood: Secondary | ICD-10-CM

## 2019-04-13 MED ORDER — VENLAFAXINE HCL 25 MG PO TABS: ORAL_TABLET | ORAL | 0 refills | Status: DC

## 2019-04-13 NOTE — Progress Notes (Signed)
Subjective:  Patient ID: Michaela Carroll, female    DOB: [birth year]  Age: 23 y.o. MRN: [id number]  CC: Pain (pt is c/o of pain/tingling in chest area,jaw,weird feeling on heart/going on 2-3 days/ lossing weight consult? )  Palpitations  This is a new problem. The current episode started more than 1 month ago. The problem occurs intermittently. The problem has been waxing and waning. On average, each episode lasts 10 minutes. The symptoms are aggravated by stress and fear (use of buspar). Associated symptoms include anxiety, dizziness, an irregular heartbeat and malaise/fatigue. Pertinent negatives include no chest fullness, chest pain, coughing, diaphoresis, fever, nausea, near-syncope, numbness, shortness of breath, syncope, vomiting or weakness. Associated symptoms comments: headache. She has tried nothing for the symptoms. There are no known risk factors. Her past medical history is significant for anxiety. There is no history of anemia, drug use, heart disease, hyperthyroidism or a valve disorder.   She is also concerned about inability to regain weight, lack of appetite, and fatigue. Food intake yesterday: bowl of cereal, skipped lunch, bowl of soup for supper. She states she gets busy and forgets to eat. She denies any nausea or diarrhea or constipation or polyuria or polydipsia. Heaviest weight was 75yrs ago at 140lbs. Weight prior to H-pylori diagnosis: 130lbs.  Wt Readings from Last 3 Encounters:  04/13/19 105 lb (47.6 kg)  02/20/19 105 lb (47.6 kg)  01/23/19 104 lb 6.4 oz (47.4 kg)   Reviewed past Medical, Social and Family history today.  Outpatient Medications Prior to Visit  Medication Sig Dispense Refill  . busPIRone (BUSPAR) 7.5 MG tablet Take 1 tablet (7.5 mg total) by mouth 3 (three) times daily. 90 tablet 11   No facility-administered medications prior to visit.    ROS See HPI  Objective:  BP 110/78   Pulse 83   Temp 98.6 F (37 C) (Tympanic)   Ht 5\' 5"  (1.651  m)   Wt 105 lb (47.6 kg)   SpO2 98%   BMI 17.47 kg/m   BP Readings from Last 3 Encounters:  04/13/19 110/78  02/20/19 106/66  01/23/19 110/72    Wt Readings from Last 3 Encounters:  04/13/19 105 lb (47.6 kg)  02/20/19 105 lb (47.6 kg)  01/23/19 104 lb 6.4 oz (47.4 kg)    Physical Exam Vitals signs reviewed.  Neck:     Musculoskeletal: Normal range of motion and neck supple.  Cardiovascular:     Rate and Rhythm: Normal rate and regular rhythm.     Pulses: Normal pulses.     Heart sounds: Normal heart sounds.  Pulmonary:     Effort: Pulmonary effort is normal.     Breath sounds: Normal breath sounds.  Lymphadenopathy:     Cervical: No cervical adenopathy.  Neurological:     Mental Status: She is alert.  Psychiatric:        Attention and Perception: Attention normal.        Mood and Affect: Mood is anxious.        Speech: Speech normal.        Behavior: Behavior is cooperative.        Thought Content: Thought content does not include homicidal or suicidal ideation.        Cognition and Memory: Cognition normal.    Lab Results  Component Value Date   WBC 5.5 01/06/2019   HGB 13.3 01/06/2019   HCT 39.5 01/06/2019   PLT 217 01/06/2019   GLUCOSE 87 02/20/2019   CHOL 168  02/20/2019   TRIG 49.0 02/20/2019   HDL 63.80 02/20/2019   LDLCALC 95 02/20/2019   ALT 9 02/20/2019   AST 14 02/20/2019   NA 139 02/20/2019   K 3.7 02/20/2019   CL 107 02/20/2019   CREATININE 0.77 02/20/2019   BUN 19 02/20/2019   CO2 25 02/20/2019   TSH 1.46 02/20/2019   Assessment & Plan:   Michaela Carroll was seen today for pain.  Diagnoses and all orders for this visit:  Adjustment disorder with mixed anxiety and depressed mood -     venlafaxine (EFFEXOR) 25 MG tablet; Take 1 tablet (25 mg total) by mouth daily after breakfast for 3 days, THEN 1 tablet (25 mg total) 2 (two) times daily with a meal for 11 days.   I have discontinued Michaela Carroll's busPIRone. I am also having her start  on venlafaxine.  Meds ordered this encounter  Medications  . venlafaxine (EFFEXOR) 25 MG tablet    Sig: Take 1 tablet (25 mg total) by mouth daily after breakfast for 3 days, THEN 1 tablet (25 mg total) 2 (two) times daily with a meal for 11 days.    Dispense:  25 tablet    Refill:  0    Order Specific Question:   Supervising Provider    Answer:   Dianne Dun [[id number]]    Problem List Items Addressed This Visit      Other   Adjustment disorder with mixed anxiety and depressed mood - Primary    Persistent anxious feelings. Unable to tolerate lexapro (nausea). She does not want to try anything in SSRI group due to possible GI side effects. Reports intermittent palpitations, headache and dizziness with buspar. Did not maintain appt with therapist due to lack of finance.  D/c buspar Start effexor 25mg  BID Schedule appt with therapist. f/up in 2weeks      Relevant Medications   venlafaxine (EFFEXOR) 25 MG tablet      Follow-up: Return in about 2 weeks (around 04/27/2019) for anxiety (F2F or virtual).  04/29/2019, NP

## 2019-04-13 NOTE — Patient Instructions (Addendum)
I think your symptoms are due to buspar side effects. Discontinue Buspar.  Start effexor 1tab in AM x 3days, then 1tab twice a day with food. You may try melatonin 5mg  at bedtime as needed. Schedule appt with therapist. Avoid soda, and caffeinated drinks.  F/up in 2weeks (F2F or virtual)  Advised about need to maintain a day, consuming nutritional drink (ensure or boost)1-2x/day when unable to get a proper meal.  High-Protein and High-Calorie Diet Eating high-protein and high-calorie foods can help you to gain weight, heal after an injury, and recover after an illness or surgery. The specific amount of daily protein and calories you need depends on:  Your body weight.  The reason this diet is recommended for you. What is my plan? Generally, a high-protein, high-calorie diet involves:  Eating 250-500 extra calories each day.  Making sure that you get enough of your daily calories from protein. Ask your health care provider how many of your calories should come from protein. Talk with a health care provider, such as a diet and nutrition specialist (dietitian), about how much protein and how many calories you need each day. Follow the diet as directed by your health care provider. What are tips for following this plan?  Preparing meals  Add whole milk, half-and-half, or heavy cream to cereal, pudding, soup, or hot cocoa.  Add whole milk to instant breakfast drinks.  Add peanut butter to oatmeal or smoothies.  Add powdered milk to baked goods, smoothies, or milkshakes.  Add powdered milk, cream, or butter to mashed potatoes.  Add cheese to cooked vegetables.  Make whole-milk yogurt parfaits. Top them with granola, fruit, or nuts.  Add cottage cheese to your fruit.  Add avocado, cheese, or both to sandwiches or salads.  Add meat, poultry, or seafood to rice, pasta, casseroles, salads, and soups.  Use mayonnaise when making egg salad, chicken salad, or tuna  salad.  Use peanut butter as a dip for vegetables or as a topping for pretzels, celery, or crackers.  Add beans to casseroles, dips, and spreads.  Add pureed beans to sauces and soups.  Replace calorie-free drinks with calorie-containing drinks, such as milk and fruit juice.  Replace water with milk or heavy cream when making foods such as oatmeal, pudding, or cocoa. General instructions  Ask your health care provider if you should take a nutritional supplement.  Try to eat six small meals each day instead of three large meals.  Eat a balanced diet. In each meal, include one food that is high in protein.  Keep nutritious snacks available, such as nuts, trail mixes, dried fruit, and yogurt.  If you have kidney disease or diabetes, talk with your health care provider about how much protein is safe for you. Too much protein may put extra stress on your kidneys.  Drink your calories. Choose high-calorie drinks and have them after your meals. What high-protein foods should I eat?  Vegetables Soybeans. Peas. Grains Quinoa. Bulgur wheat. Meats and other proteins Beef, pork, and poultry. Fish and seafood. Eggs. Tofu. Textured vegetable protein (TVP). Peanut butter. Nuts and seeds. Dried beans. Protein powders. Dairy Whole milk. Whole-milk yogurt. Powdered milk. Cheese. . Eggnog. Beverages High-protein supplement drinks. Soy milk. Other foods Protein bars. The items listed above may not be a complete list of high-protein foods and beverages. Contact a dietitian for more options. What high-calorie foods should I eat? Fruits Dried fruit. Fruit leather. Canned fruit in syrup. Fruit juice. Avocado. Vegetables Vegetables cooked in  oil or butter. Fried potatoes. Grains Pasta. Quick breads. Muffins. Pancakes. Ready-to-eat cereal. Meats and other proteins Peanut butter. Nuts and seeds. Dairy Heavy cream. Whipped cream. Cream cheese. Sour cream. Ice cream. Custard.  Pudding. Beverages Meal-replacement beverages. Nutrition shakes. Fruit juice. Sugar-sweetened soft drinks. Seasonings and condiments Salad dressing. Mayonnaise. Alfredo sauce. Fruit preserves or jelly. Honey. Syrup. Sweets and desserts Cake. Cookies. Pie. Pastries. Candy bars. Chocolate. Fats and oils Butter or margarine. Oil. Gravy. Other foods Meal-replacement bars. The items listed above may not be a complete list of high-calorie foods and beverages. Contact a dietitian for more options. Summary  A high-protein, high-calorie diet can help you gain weight or heal faster after an injury, illness, or surgery.  To increase your protein and calories, add ingredients such as whole milk, peanut butter, cheese, beans, meat, or seafood to meal items.  To get enough extra calories each day, include high-calorie foods and beverages at each meal.  Adding a high-calorie drink or shake can be an easy way to help you get enough calories each day. Talk with your healthcare provider or dietitian about the best options for you. This information is not intended to replace advice given to you by your health care provider. Make sure you discuss any questions you have with your health care provider. Document Released: 06/25/2005 Document Revised: 06/07/2017 Document Reviewed: 05/07/2017 Elsevier Patient Education  2020 [employer].

## 2019-04-13 NOTE — Assessment & Plan Note (Signed)
Persistent anxious feelings. Unable to tolerate lexapro (nausea). She does not want to try anything in SSRI group due to possible GI side effects. Reports intermittent palpitations, headache and dizziness with buspar. Did not maintain appt with therapist due to lack of finance.  D/c buspar Start effexor 25mg  BID Schedule appt with therapist. f/up in 2weeks

## 2019-05-18 ENCOUNTER — Encounter: Payer: Self-pay | Admitting: Nurse Practitioner

## 2019-05-18 ENCOUNTER — Ambulatory Visit: Payer: Self-pay | Admitting: Nurse Practitioner

## 2019-05-18 ENCOUNTER — Other Ambulatory Visit: Payer: Self-pay

## 2019-05-18 ENCOUNTER — Ambulatory Visit: Payer: Self-pay

## 2019-05-18 VITALS — BP 112/74 | HR 91 | Temp 96.3°F | Ht 65.0 in | Wt 105.4 lb

## 2019-05-18 DIAGNOSIS — F4323 Adjustment disorder with mixed anxiety and depressed mood: Secondary | ICD-10-CM

## 2019-05-18 DIAGNOSIS — R8290 Unspecified abnormal findings in urine: Secondary | ICD-10-CM

## 2019-05-18 DIAGNOSIS — N3001 Acute cystitis with hematuria: Secondary | ICD-10-CM

## 2019-05-18 LAB — POCT URINALYSIS DIPSTICK
Bilirubin, UA: NEGATIVE
Glucose, UA: NEGATIVE
Ketones, UA: NEGATIVE
Nitrite, UA: NEGATIVE
Protein, UA: POSITIVE — AB
Spec Grav, UA: 1.025 (ref 1.010–1.025)
Urobilinogen, UA: 0.2 E.U./dL
pH, UA: 6 (ref 5.0–8.0)

## 2019-05-18 MED ORDER — SULFAMETHOXAZOLE-TRIMETHOPRIM 800-160 MG PO TABS: 1.0000 | ORAL_TABLET | Freq: Two times a day (BID) | ORAL | 0 refills | Status: DC

## 2019-05-18 MED ORDER — VENLAFAXINE HCL 25 MG PO TABS: 25.0000 mg | ORAL_TABLET | Freq: Two times a day (BID) | ORAL | 5 refills | Status: DC

## 2019-05-18 NOTE — Progress Notes (Signed)
Subjective:  Patient ID: Michaela Carroll, female    DOB: 10/05/1995  Age: 23 y.o. MRN: [id number]  CC: Urinary Tract Infection (pt is c/o of abd pain on left side,dysuria,notice blood in urine,frequent urinate/started 2 days/drink alot of water and advil. )  Urinary Tract Infection  This is a new problem. The current episode started in the past 7 days. The problem occurs every urination. The problem has been unchanged. The quality of the pain is described as aching and burning. The pain is mild. There has been no fever. She is sexually active. There is no history of pyelonephritis. Associated symptoms include frequency, hematuria and urgency. Pertinent negatives include no chills, discharge, flank pain, hesitancy, nausea, possible pregnancy or vomiting. She has tried increased fluids and NSAIDs for the symptoms. The treatment provided mild relief. There is no history of catheterization, kidney stones, recurrent UTIs, a single kidney, urinary stasis or a urological procedure.  denies any hx of renal stone or recurrent UTI or constipation or diarrhea.   Anxiety: Stable mood with effexor. She denies any adverse effects with medication.  Stable weight Wt Readings from Last 3 Encounters:  05/18/19 105 lb 6.4 oz (47.8 kg)  04/13/19 105 lb (47.6 kg)  02/20/19 105 lb (47.6 kg)    Reviewed past Medical, Social and Family history today.  Outpatient Medications Prior to Visit  Medication Sig Dispense Refill  . venlafaxine (EFFEXOR) 25 MG tablet Take 1 tablet (25 mg total) by mouth daily after breakfast for 3 days, THEN 1 tablet (25 mg total) 2 (two) times daily with a meal for 11 days. 25 tablet 0   No facility-administered medications prior to visit.     ROS See HPI  Objective:  BP 112/74   Pulse 91   Temp (!) 96.3 F (35.7 C) (Tympanic)   Ht 5\' 5"  (1.651 m)   Wt 105 lb 6.4 oz (47.8 kg)   SpO2 99%   BMI 17.54 kg/m   BP Readings from Last 3 Encounters:  05/18/19 112/74  04/13/19  110/78  02/20/19 106/66    Wt Readings from Last 3 Encounters:  05/18/19 105 lb 6.4 oz (47.8 kg)  04/13/19 105 lb (47.6 kg)  02/20/19 105 lb (47.6 kg)    Physical Exam Vitals signs reviewed.  Constitutional:      General: She is not in acute distress.    Appearance: She is not ill-appearing.  Cardiovascular:     Rate and Rhythm: Normal rate.     Pulses: Normal pulses.  Pulmonary:     Effort: Pulmonary effort is normal.  Abdominal:     General: Bowel sounds are normal. There is no distension.     Palpations: Abdomen is soft. There is no mass.     Tenderness: There is no abdominal tenderness. There is no right CVA tenderness, left CVA tenderness, guarding or rebound.  Skin:    Findings: No erythema or rash.  Neurological:     Mental Status: She is alert and oriented to person, place, and time.    Lab Results  Component Value Date   WBC 5.5 01/06/2019   HGB 13.3 01/06/2019   HCT 39.5 01/06/2019   PLT 217 01/06/2019   GLUCOSE 87 02/20/2019   CHOL 168 02/20/2019   TRIG 49.0 02/20/2019   HDL 63.80 02/20/2019   LDLCALC 95 02/20/2019   ALT 9 02/20/2019   AST 14 02/20/2019   NA 139 02/20/2019   K 3.7 02/20/2019   CL 107 02/20/2019  CREATININE 0.77 02/20/2019   BUN 19 02/20/2019   CO2 25 02/20/2019   TSH 1.46 02/20/2019    No results found.  Assessment & Plan:   Tajuanna was seen today for urinary tract infection.  Diagnoses and all orders for this visit:  Abnormal finding on urinalysis -     POCT urinalysis dipstick -     sulfamethoxazole-trimethoprim (BACTRIM DS) 800-160 MG tablet; Take 1 tablet by mouth 2 (two) times daily. With food -     Urine culture  Acute cystitis with hematuria -     POCT urinalysis dipstick -     sulfamethoxazole-trimethoprim (BACTRIM DS) 800-160 MG tablet; Take 1 tablet by mouth 2 (two) times daily. With food -     Urine culture  Adjustment disorder with mixed anxiety and depressed mood -     venlafaxine (EFFEXOR) 25 MG tablet;  Take 1 tablet (25 mg total) by mouth 2 (two) times daily with a meal.   I have changed Michaela Carroll's venlafaxine. I am also having her start on sulfamethoxazole-trimethoprim.  Meds ordered this encounter  Medications  . venlafaxine (EFFEXOR) 25 MG tablet    Sig: Take 1 tablet (25 mg total) by mouth 2 (two) times daily with a meal.    Dispense:  60 tablet    Refill:  5    Order Specific Question:   Supervising Provider    Answer:   Dianne Dun [[id number]]  . sulfamethoxazole-trimethoprim (BACTRIM DS) 800-160 MG tablet    Sig: Take 1 tablet by mouth 2 (two) times daily. With food    Dispense:  10 tablet    Refill:  0    Order Specific Question:   Supervising Provider    Answer:   Dianne Dun [[id number]]    Problem List Items Addressed This Visit      Other   Adjustment disorder with mixed anxiety and depressed mood    Stable mood with effexor. She denies any adverse effects with medication. F/up in 43months      Relevant Medications   venlafaxine (EFFEXOR) 25 MG tablet    Other Visit Diagnoses    Abnormal finding on urinalysis    -  Primary   Relevant Medications   sulfamethoxazole-trimethoprim (BACTRIM DS) 800-160 MG tablet   Other Relevant Orders   POCT urinalysis dipstick (Completed)   Urine culture   Acute cystitis with hematuria       Relevant Medications   sulfamethoxazole-trimethoprim (BACTRIM DS) 800-160 MG tablet   Other Relevant Orders   POCT urinalysis dipstick (Completed)   Urine culture      Follow-up: Return in about 3 months (around 08/18/2019) for anxiety.  Alysia Penna, NP

## 2019-05-18 NOTE — Assessment & Plan Note (Signed)
Stable mood with effexor. She denies any adverse effects with medication. F/up in 3months

## 2019-05-18 NOTE — Telephone Encounter (Signed)
Pt. Called to report onset of discoloration of urine with burning upon urination, urgency and frequency, on   Saturday.  Stated her urine progressed from tea colored to bright red, last night.  Reported urine bright red with clots this morning.  Stated the burning discomfort is intense at times.  Reported she has had intermittent pain in left side.  Denied fever or chills.  Denied other symptoms.  Reported she has been taking Ibuprofen for the pain and has increased her water intake.   Pt. Transferred to Scheduler for an appt.   Agreed with plan.   Reason for Disposition . Blood in urine  (Exception: could be normal menstrual bleeding)  Answer Assessment - Initial Assessment Questions 1. COLOR of URINE: "Describe the color of the urine."  (e.g., tea-colored, pink, red, blood clots, bloody)     Tea- colored progressed to bright red with clots 2. ONSET: "When did the bleeding start?"      Sat. AM  3. EPISODES: "How many times has there been blood in the urine?" or "How many times today?"     Onset on Sat. ; increased color to bright red  4. PAIN with URINATION: "Is there any pain with passing your urine?" If so, ask: "How bad is the pain?"  (Scale 1-10; or mild, moderate, severe)    - MILD - complains slightly about urination hurting    - MODERATE - interferes with normal activities      - SEVERE - excruciating, unwilling or unable to urinate because of the pain      Intense burning with urination 5. FEVER: "Do you have a fever?" If so, ask: "What is your temperature, how was it measured, and when did it start?"     No  6. ASSOCIATED SYMPTOMS: "Are you passing urine more frequently than usual?"     Increased urgency and burning      7. OTHER SYMPTOMS: "Do you have any other symptoms?" (e.g., back/flank pain, abdominal pain, vomiting)     Left side pain that comes and goes ; no abd. Pain, nausea or vomiting  8. PREGNANCY: "Is there any chance you are pregnant?" "When was your last menstrual  period?"     LMP 10/17  Protocols used: URINE - BLOOD IN-A-AH

## 2019-05-18 NOTE — Patient Instructions (Signed)
Abnormal urinalysis. Urine sent for culture Start bactrim (oral abx) with food x 5days. Call office if no improvement in 48hrs. Continue to push oral hydration with water.

## 2019-05-21 LAB — URINE CULTURE
MICRO NUMBER:: 1079008
SPECIMEN QUALITY:: ADEQUATE

## 2019-05-26 ENCOUNTER — Encounter: Payer: Self-pay | Admitting: Nurse Practitioner

## 2019-12-21 IMAGING — CR CHEST - 2 VIEW
2 series · 2 of 2 positions shown · non-contrast
Comparison: 05/21/2011 scoliosis radiographs.

CLINICAL DATA: Shortness of breath. Vomiting for weeks. And ID
today.

EXAM:
CHEST - 2 VIEW

[chest pa]
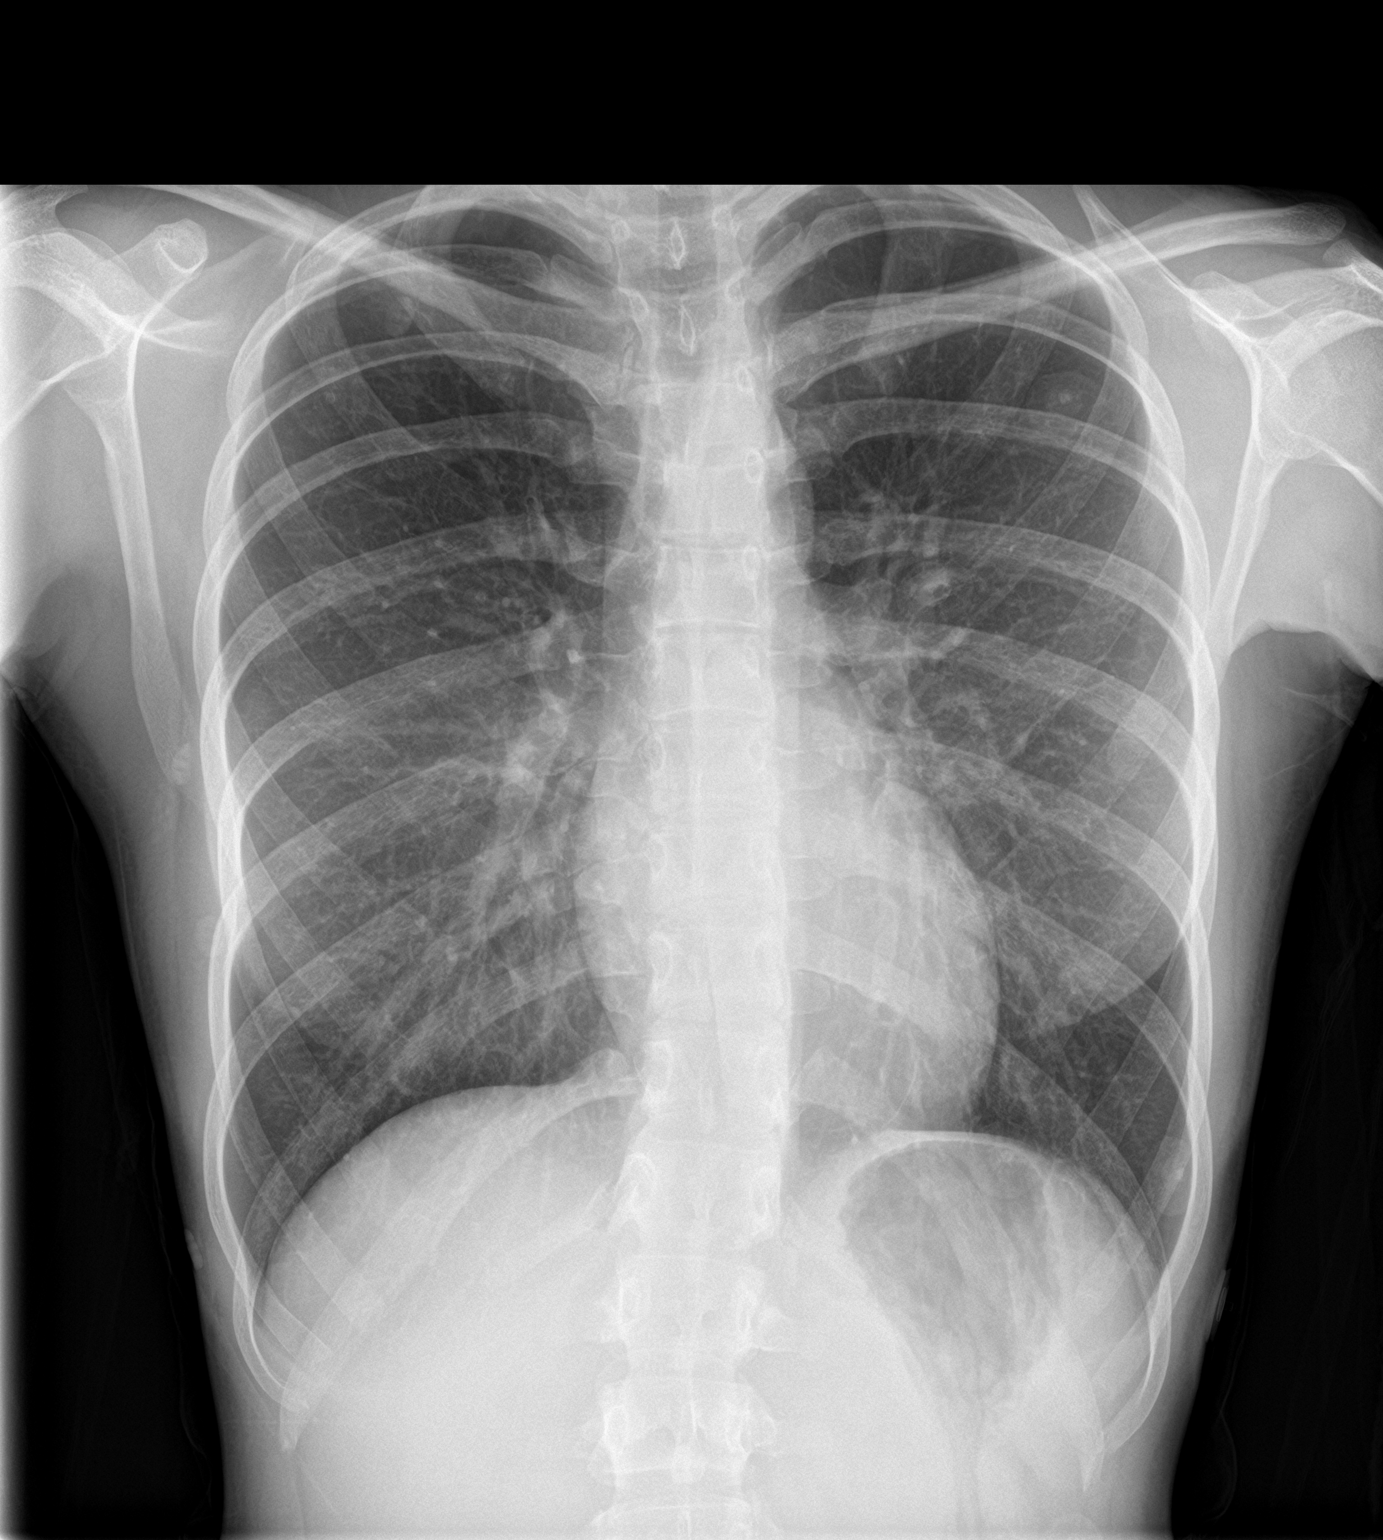

[chest lat]
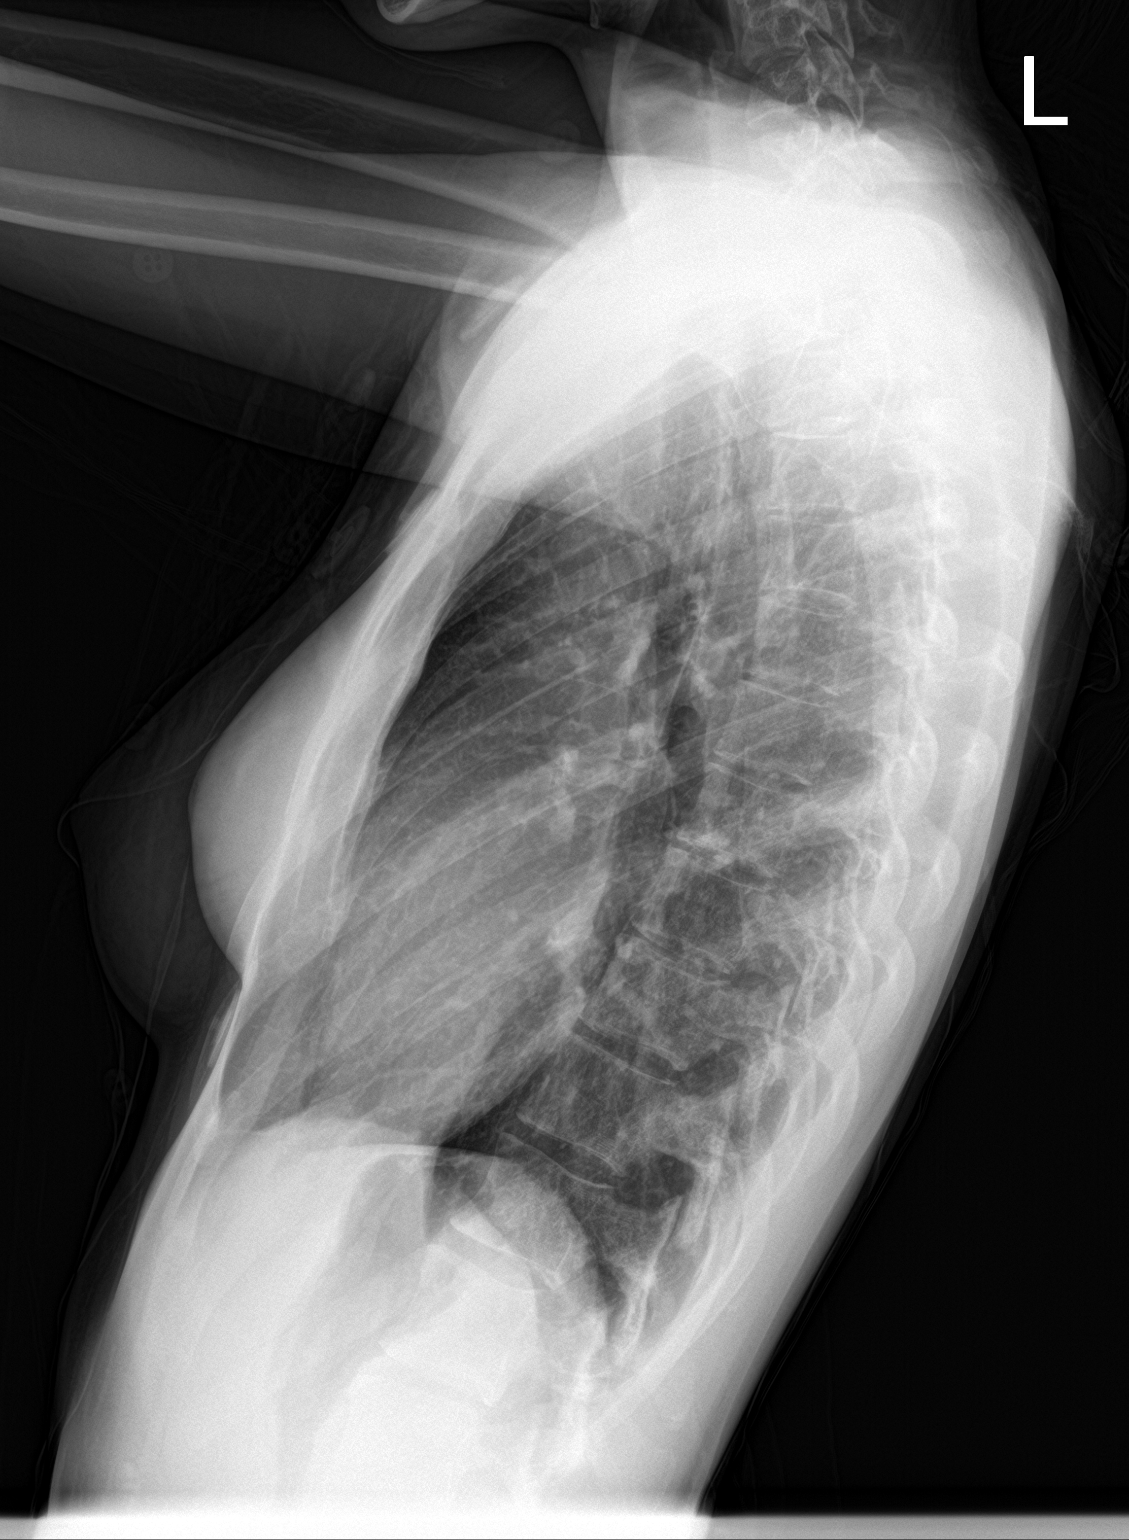

[2 of 2 positions shown; findings below may reference images not displayed]

FINDINGS: The cardiomediastinal silhouette is within normal limits. The lungs
are well inflated and clear. No pleural effusion or pneumothorax is
identified. There is mild lower thoracic levoscoliosis.
IMPRESSION: No active cardiopulmonary disease.

## 2020-04-27 ENCOUNTER — Other Ambulatory Visit: Payer: Self-pay

## 2020-04-28 ENCOUNTER — Encounter: Payer: Self-pay | Admitting: Nurse Practitioner

## 2020-04-28 ENCOUNTER — Ambulatory Visit (INDEPENDENT_AMBULATORY_CARE_PROVIDER_SITE_OTHER): Payer: Self-pay | Admitting: Nurse Practitioner

## 2020-04-28 VITALS — BP 110/60 | HR 100 | Temp 98.7°F | Ht 65.0 in | Wt 111.2 lb

## 2020-04-28 DIAGNOSIS — F411 Generalized anxiety disorder: Secondary | ICD-10-CM

## 2020-04-28 MED ORDER — VENLAFAXINE HCL 25 MG PO TABS: 25.0000 mg | ORAL_TABLET | Freq: Two times a day (BID) | ORAL | 3 refills | Status: AC

## 2020-04-28 NOTE — Assessment & Plan Note (Addendum)
Improved and stable mood with effexor 25mg  BID Resolved palpitations, she thinks this was due to buspar Denies any adverse side effects with effexor She plans to establish care with new pcp in [place], [place] Moving in 2weeks Refill sent to last 1year

## 2020-04-28 NOTE — Patient Instructions (Signed)
I wish you the best and success with your new adventure Stay safe and have fun.

## 2020-04-28 NOTE — Progress Notes (Signed)
   Subjective:  Patient ID: Michaela [place], female    DOB: 06-05-1996  Age: 24 y.o. MRN: [id number]  CC: Follow-up (Pt states she is moving in 2 weeks and would like to follow up on anxiety and make sure things are in order before moving. )  HPI  GAD (generalized anxiety disorder) Improved and stable mood with effexor 25mg  BID Resolved palpitations, she thinks this was due to buspar Denies any adverse side effects with effexor She plans to establish care with new pcp in [place], [place] Moving in 2weeks Refill sent to last 1year  Wt Readings from Last 3 Encounters:  04/28/20 111 lb 3.2 oz (50.4 kg)  05/18/19 105 lb 6.4 oz (47.8 kg)  04/13/19 105 lb (47.6 kg)   Reviewed past Medical, Social and Family history today.  Outpatient Medications Prior to Visit  Medication Sig Dispense Refill  . venlafaxine (EFFEXOR) 25 MG tablet Take 1 tablet (25 mg total) by mouth 2 (two) times daily with a meal. 60 tablet 5  . sulfamethoxazole-trimethoprim (BACTRIM DS) 800-160 MG tablet Take 1 tablet by mouth 2 (two) times daily. With food (Patient not taking: Reported on 04/28/2020) 10 tablet 0   No facility-administered medications prior to visit.    ROS See HPI  Objective:  BP 110/60 (BP Location: Left Arm, Patient Position: Sitting, Cuff Size: Normal)   Pulse 100   Temp 98.7 F (37.1 C) (Temporal)   Ht 5\' 5"  (1.651 m)   Wt 111 lb 3.2 oz (50.4 kg)   SpO2 100%   BMI 18.50 kg/m   Physical Exam Vitals reviewed.  Cardiovascular:     Rate and Rhythm: Normal rate and regular rhythm.     Pulses: Normal pulses.     Heart sounds: Normal heart sounds.  Neurological:     Mental Status: She is alert and oriented to person, place, and time.  Psychiatric:        Mood and Affect: Mood normal.        Behavior: Behavior normal.        Thought Content: Thought content normal.     Assessment & Plan:  This visit occurred during the SARS-CoV-2 public health emergency.  Safety protocols were in  place, including screening questions prior to the visit, additional usage of staff PPE, and extensive cleaning of exam room while observing appropriate contact time as indicated for disinfecting solutions.   Michaela [place] was seen today for follow-up.  Diagnoses and all orders for this visit:  GAD (generalized anxiety disorder) -     venlafaxine (EFFEXOR) 25 MG tablet; Take 1 tablet (25 mg total) by mouth 2 (two) times daily with a meal.   Problem List Items Addressed This Visit      Other   GAD (generalized anxiety disorder) - Primary    Improved and stable mood with effexor 25mg  BID Resolved palpitations, she thinks this was due to buspar Denies any adverse side effects with effexor She plans to establish care with new pcp in [place], [place] Moving in 2weeks Refill sent to last 1year      Relevant Medications   venlafaxine (EFFEXOR) 25 MG tablet      Follow-up: No follow-ups on file.  , NP

## 2020-05-09 ENCOUNTER — Encounter: Payer: Self-pay | Admitting: Nurse Practitioner

## 2024-01-25 ENCOUNTER — Encounter (HOSPITAL_COMMUNITY): Payer: Self-pay

## 2024-01-25 ENCOUNTER — Other Ambulatory Visit: Payer: Self-pay

## 2024-01-25 ENCOUNTER — Emergency Department (HOSPITAL_COMMUNITY)
Admission: EM | Admit: 2024-01-25 | Discharge: 2024-01-25 | Disposition: A | Discharge status: Home / Self Care | Attending: Emergency Medicine | Admitting: Emergency Medicine

## 2024-01-25 ENCOUNTER — Emergency Department (HOSPITAL_COMMUNITY)

## 2024-01-25 DIAGNOSIS — R0789 Other chest pain: Secondary | ICD-10-CM | POA: Diagnosis not present

## 2024-01-25 DIAGNOSIS — R079 Chest pain, unspecified: Secondary | ICD-10-CM | POA: Diagnosis present

## 2024-01-25 LAB — BASIC METABOLIC PANEL WITH GFR
Anion gap: 9 (ref 5–15)
BUN: 26 mg/dL — ABNORMAL HIGH (ref 6–20)
CO2: 22 mmol/L (ref 22–32)
Calcium: 9.3 mg/dL (ref 8.9–10.3)
Chloride: 107 mmol/L (ref 98–111)
Creatinine, Ser: 0.85 mg/dL (ref 0.44–1.00)
GFR, Estimated: >60 mL/min (ref >60)
Glucose, Bld: 101 mg/dL — ABNORMAL HIGH (ref 70–99)
Potassium: 3.6 mmol/L (ref 3.5–5.1)
Sodium: 138 mmol/L (ref 135–145)

## 2024-01-25 LAB — CBC
HCT: 37 % (ref 36.0–46.0)
Hemoglobin: 12.3 g/dL (ref 12.0–15.0)
MCH: 32 pg (ref 26.0–34.0)
MCHC: 33.2 g/dL (ref 30.0–36.0)
MCV: 96.4 fL (ref 80.0–100.0)
Platelets: 195 K/uL (ref 150–400)
RBC: 3.84 MIL/uL — ABNORMAL LOW (ref 3.87–5.11)
RDW: 11.4 % — ABNORMAL LOW (ref 11.5–15.5)
WBC: 6.6 K/uL (ref 4.0–10.5)
nRBC: 0 % (ref 0.0–0.2)

## 2024-01-25 LAB — T4, FREE: Free T4: 0.94 ng/dL (ref 0.61–1.12)

## 2024-01-25 LAB — HCG, SERUM, QUALITATIVE: Preg, Serum: NEGATIVE

## 2024-01-25 LAB — TSH: TSH: 2.72 u[IU]/mL (ref 0.350–4.500)

## 2024-01-25 LAB — TROPONIN I (HIGH SENSITIVITY): Troponin I (High Sensitivity): <2 ng/L (ref <18)

## 2024-01-25 NOTE — Discharge Instructions (Addendum)
 Tylenol or motrin as needed for pain.  Make sure to hydrate well.  Heat can help relax muscles. Recommend to establish care with primary care-- [facility] [place] [place] [facility] large groups here in [place].  May want to look into one of those.  Offices throughout [place]. Return here for any new/acute changes-- passing out, turning blue, can't breathe, coughing up blood, vomiting, etc.

## 2024-01-25 NOTE — ED Notes (Addendum)
 Pt stated she has a new left leg pain on her thigh, feels similar to the chest pain.  She states that she normally gets right leg pain during menstruation, never on the left.  She id not currently menstruating.

## 2024-01-25 NOTE — ED Provider Notes (Signed)
 [place] EMERGENCY DEPARTMENT AT [facility] Provider Note   CSN: [id number] Arrival date & time: 01/25/24  9951     Patient presents with: Chest Pain (PT stated she started having back pain earlier today, it moved to her chest under her left breast, and down her left arm)   [place] is a 28 y.o. female.   The history is provided by the patient and medical records.  Chest Pain   28 year old female with history of anxiety, depression, presenting to the ED with chest pain.  States while running errands today she had a little bit of chest and back pain, seem more localized to the shoulders.  She took some over-the-counter medication and it seemed to improve.  This evening around 11:30 PM she had return of pain but this time more so beneath the left breast.  States it lasts for split-second or 2 and then resolves but was occurring frequently.  Seems to have dissipated now.  She denies any significant pain, shortness of breath, or pain with deep breathing.  States she does have a history of anxiety but this feels different than prior anxiety attacks.  No history of DVT or PE.  Not currently on exogenous estrogens.  Mother reports large family history of thyroid  issues and was concerned this may be early presentation of such.  Patient has had prior TSH testing that was all normal.  Prior to Admission medications   Medication Sig Start Date End Date Taking? Authorizing Provider  venlafaxine  (EFFEXOR ) 25 MG tablet Take 1 tablet (25 mg total) by mouth 2 (two) times daily with a meal. 04/28/20   Nche, Roselie Rockford, NP    Allergies: Penicillins    Review of Systems  Cardiovascular:  Positive for chest pain.  All other systems reviewed and are negative.   Updated Vital Signs BP 110/69   Pulse 85   Temp 98.4 F (36.9 C) (Oral)   Resp 11   Ht 5' 5 (1.651 m)   Wt 48.4 kg   SpO2 100%   BMI 17.77 kg/m   Physical Exam Vitals and nursing note reviewed.   Constitutional:      Appearance: She is well-developed.  HENT:     Head: Normocephalic and atraumatic.  Eyes:     Conjunctiva/sclera: Conjunctivae normal.     Pupils: Pupils are equal, round, and reactive to light.  Cardiovascular:     Rate and Rhythm: Normal rate and regular rhythm.     Heart sounds: Normal heart sounds.  Pulmonary:     Effort: Pulmonary effort is normal.     Breath sounds: Normal breath sounds.  Abdominal:     General: Bowel sounds are normal.     Palpations: Abdomen is soft.  Musculoskeletal:        General: Normal range of motion.     Cervical back: Normal range of motion.  Skin:    General: Skin is warm and dry.  Neurological:     Mental Status: She is alert and oriented to person, place, and time.     (all labs ordered are listed, but only abnormal results are displayed) Labs Reviewed  BASIC METABOLIC PANEL WITH GFR - Abnormal; Notable for the following components:      Result Value   Glucose, Bld 101 (*)    BUN 26 (*)    All other components within normal limits  CBC - Abnormal; Notable for the following components:   RBC 3.84 (*)    RDW 11.4 (*)  All other components within normal limits  HCG, SERUM, QUALITATIVE  TSH  T4, FREE  TROPONIN I (HIGH SENSITIVITY)    EKG: None   Radiology: DG Chest Port 1 View Result Date: 01/25/2024 CLINICAL DATA:  Back pain and pain under the left breast. EXAM: PORTABLE CHEST 1 VIEW COMPARISON:  January 06, 2019 FINDINGS: The heart size and mediastinal contours are within normal limits. Both lungs are clear. The visualized skeletal structures are unremarkable. IMPRESSION: No active disease. Electronically Signed   By: Suzen Dials M.D.   On: 01/25/2024 02:20     Procedures   Medications Ordered in the ED - No data to display                                  Medical Decision Making Amount and/or Complexity of Data Reviewed Labs: ordered. Radiology: ordered and independent interpretation  performed. ECG/medicine tests: ordered and independent interpretation performed.   28 year old female presenting to the ED with chest pain.  Had some earlier in the day, more persistent this evening.  Fleeting type pain lasting only a few seconds.  Seems to be resolving by time of my evaluation.  Mother had concern for possible thyroid  disease as her symptoms presented similarly.  EKG is nonischemic.   Labs as above--no leukocytosis or anemia.  No electrolyte derangement.  Troponin is negative.  Chest x-ray is clear.  She has no tachycardia or hypoxia.  She is PERC negative.  Low suspicion for DVT or PE.  TSH sent at patient request and is normal.  She does not currently have a PCP, encouraged to establish care as soon as possible.  Discussed good hydration, over-the-counter pain relievers as needed.  Return precautions given for any new or acute changes.  Final diagnoses:  Atypical chest pain    ED Discharge Orders     None          Jarold Olam CHRISTELLA DEVONNA 01/25/24 0615    Palumbo, April, MD 01/25/24 [phone]

## 2024-01-25 NOTE — ED Triage Notes (Signed)
 PT stated she started having back pain earlier today, it moved to her chest under her left breast, and down her left arm

## 2024-03-03 ENCOUNTER — Other Ambulatory Visit: Payer: Self-pay | Admitting: Family Medicine

## 2024-03-03 DIAGNOSIS — N63 Unspecified lump in unspecified breast: Secondary | ICD-10-CM

## 2024-03-18 ENCOUNTER — Ambulatory Visit
Admission: RE | Admit: 2024-03-18 | Discharge: 2024-03-18 | Disposition: A | Discharge status: Home / Self Care | Source: Ambulatory Visit | Attending: Family Medicine | Admitting: Family Medicine

## 2024-03-18 DIAGNOSIS — N63 Unspecified lump in unspecified breast: Secondary | ICD-10-CM
